# Patient Record
Sex: Male | Born: 2000 | Race: Black or African American | Hispanic: No | Marital: Single | Smoking: Never smoker
Health system: Southern US, Community
[De-identification: ages and names within clinical notes are randomized; demographics above are authoritative.]

## PROBLEM LIST (undated history)

## (undated) ENCOUNTER — Ambulatory Visit: Admission: EM | Payer: Self-pay | Source: Home / Self Care

## (undated) DIAGNOSIS — F988 Other specified behavioral and emotional disorders with onset usually occurring in childhood and adolescence: Secondary | ICD-10-CM

## (undated) DIAGNOSIS — Z9109 Other allergy status, other than to drugs and biological substances: Secondary | ICD-10-CM

---
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
## 2022-12-07 ENCOUNTER — Ambulatory Visit
Admission: EM | Admit: 2022-12-07 | Discharge: 2022-12-07 | Disposition: A | Discharge status: Home / Self Care | Payer: Managed Care, Other (non HMO) | Attending: Internal Medicine | Admitting: Internal Medicine

## 2022-12-07 DIAGNOSIS — M79672 Pain in left foot: Secondary | ICD-10-CM

## 2022-12-07 DIAGNOSIS — M79671 Pain in right foot: Secondary | ICD-10-CM | POA: Diagnosis not present

## 2022-12-07 MED ORDER — NAPROXEN 500 MG PO TABS: 500.0000 mg | ORAL_TABLET | Freq: Two times a day (BID) | ORAL | 0 refills | Status: AC | PRN

## 2022-12-07 NOTE — Discharge Instructions (Signed)
You may start naproxen twice daily for 7 days as needed.  Elevate your feet as needed.  I recommend you get shoe inserts as well as wear compression stockings while at work.  Please follow-up with your PCP if your symptoms do not improve.  Please go to the emergency room if you develop any worsening symptoms.  I hope you feel better soon!

## 2022-12-07 NOTE — ED Provider Notes (Signed)
[facility]    CSN: [id number] Arrival date & time: 12/07/22  1022      History   Chief Complaint Chief Complaint  Patient presents with   Foot Pain   Leg Pain    HPI Roy Crane IV is a 22 y.o. male presents for foot pain.  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  Reports he normally has some foot soreness after work but normally by the morning it is better or resolved.  He worked last night and states when he woke this morning he continues to have foot soreness/pain.  States he has no pain if he is sitting and resting and pain is only with weightbearing or walking.  Denies any change in work activities or duties on working more than normal.  Denies any swelling, ecchymosis, erythema.  No injury to the feet.  Does state that the pain will radiate up into his legs but denies calf pain, swelling, redness, warmth.  No chest pain or shortness of breath.  No history of DVT or clotting disorders.  He took ibuprofen last night with minimal improvement.  He has not taken anything today for symptoms.  No other concerns at this time.   Foot Pain  Leg Pain   Past Medical History:  Diagnosis Date   ADD (attention deficit disorder)    Asthma    Environmental allergies     Patient Active Problem List   Diagnosis Date Noted   Asthma 05/06/2010    History reviewed. No pertinent surgical history.     Home Medications    Prior to Admission medications   Medication Sig Start Date End Date Taking? Authorizing Provider  naproxen (NAPROSYN) 500 MG tablet Take 1 tablet (500 mg total) by mouth 2 (two) times daily as needed for up to 7 days (foot pain). 12/07/22 12/14/22 Yes Radford Pax, NP  albuterol (PROVENTIL) (2.5 MG/3ML) 0.083% nebulizer solution Take 2.5 mg by nebulization every 6 (six) hours as needed.      [provider]  beclomethasone (QVAR) 40 MCG/ACT inhaler Inhale into the lungs 2 (two) times daily.    [provider]  lisdexamfetamine (VYVANSE) 40 MG capsule Take 40 mg by mouth every morning.    [provider]    Family History Family History  Problem Relation Age of Onset   Diabetes Other    Stroke Other    Heart failure Other    Cancer Other        colon   Diabetes Other     Social History Social History   Tobacco Use   Smoking status: Never   Smokeless tobacco: Never  Substance Use Topics   Alcohol use: No   Drug use: No     Allergies   Patient has no known allergies.   Review of Systems Review of Systems  Musculoskeletal:        Bilateral foot pain     Physical Exam Triage Vital Signs ED Triage Vitals [12/07/22 1112]  Encounter Vitals Group     BP (!) 140/83     Systolic BP Percentile      Diastolic BP Percentile      Pulse Rate 60     Resp 17     Temp 98.1 F (36.7 C)     Temp Source Oral     SpO2 98 %     Weight      Height  Head Circumference      Peak Flow      Pain Score 2     Pain Loc      Pain Education      Exclude from Growth Chart    No data found.  Updated Vital Signs BP (!) 140/83 (BP Location: Right Arm)   Pulse 60   Temp 98.1 F (36.7 C) (Oral)   Resp 17   SpO2 98%   Visual Acuity Right Eye Distance:   Left Eye Distance:   Bilateral Distance:    Right Eye Near:   Left Eye Near:    Bilateral Near:     Physical Exam Vitals and nursing note reviewed.  Constitutional:      General: He is not in acute distress.    Appearance: Normal appearance. He is not ill-appearing.  HENT:     Head: Normocephalic and atraumatic.  Eyes:     Pupils: Pupils are equal, round, and reactive to light.  Cardiovascular:     Rate and Rhythm: Normal rate.  Pulmonary:     Effort: Pulmonary effort is normal.  Musculoskeletal:       Feet:  Feet:     Comments: There is no swelling, ecchymosis, erythema of the foot.  There is tenderness to palpation to the mid plantar aspect of foot bilaterally.  No tenderness with  palpation to dorsum of foot, heel, bilateral lower legs/calves.  Right calf measures 48 cm, left calf measures 47 cm.  No tenderness to palpation along posterior calf.  No redness or warmth of calf bilaterally.  Negative Homans' sign. Skin:    General: Skin is warm and dry.  Neurological:     General: No focal deficit present.     Mental Status: He is alert and oriented to person, place, and time.  Psychiatric:        Mood and Affect: Mood normal.        Behavior: Behavior normal.      UC Treatments / Results  Labs (all labs ordered are listed, but only abnormal results are displayed) Labs Reviewed - No data to display  EKG   Radiology No results found.  Procedures Procedures (including critical care time)  Medications Ordered in UC Medications - No data to display  Initial Impression / Assessment and Plan / UC Course  I have reviewed the triage vital signs and the nursing notes.  Pertinent labs & imaging results that were available during my care of the patient were reviewed by me and considered in my medical decision making (see chart for details).     Reviewed exam and symptoms with patient.  No red flags.  Patient with bilateral foot pain/aching after working on his feet all day yesterday.  Discussed with patient likely muscle soreness from walking on hard surface at work.  Will do trial of naproxen twice daily.  Patient declined injection in clinic.  Also advised shoe inserts and compression stockings while at work.  Patient to follow-up with his PCP if his symptoms do not improve.  ER precautions reviewed and patient verbalized understanding. Final Clinical Impressions(s) / UC Diagnoses   Final diagnoses:  Bilateral foot pain     Discharge Instructions      You may start naproxen twice daily for 7 days as needed.  Elevate your feet as needed.  I recommend you get shoe inserts as well as wear compression stockings while at work.  Please follow-up with your PCP if  your symptoms do not  improve.  Please go to the emergency room if you develop any worsening symptoms.  I hope you feel better soon!    ED Prescriptions     Medication Sig Dispense Auth. Provider   naproxen (NAPROSYN) 500 MG tablet Take 1 tablet (500 mg total) by mouth 2 (two) times daily as needed for up to 7 days (foot pain). 14 tablet Radford Pax, NP      PDMP not reviewed this encounter.   Radford Pax, NP 12/07/22 [phone]

## 2022-12-07 NOTE — ED Triage Notes (Signed)
Pt presents with c/o bilateral foot and leg pain x 2 days. Pt states it hurts when he walks and denies injuries.

## 2023-05-07 ENCOUNTER — Ambulatory Visit
Admission: EM | Admit: 2023-05-07 | Discharge: 2023-05-07 | Disposition: A | Discharge status: Home / Self Care | Attending: Family Medicine | Admitting: Family Medicine

## 2023-05-07 ENCOUNTER — Encounter: Payer: Self-pay | Admitting: Emergency Medicine

## 2023-05-07 DIAGNOSIS — B349 Viral infection, unspecified: Secondary | ICD-10-CM | POA: Insufficient documentation

## 2023-05-07 DIAGNOSIS — J4521 Mild intermittent asthma with (acute) exacerbation: Secondary | ICD-10-CM | POA: Insufficient documentation

## 2023-05-07 DIAGNOSIS — R051 Acute cough: Secondary | ICD-10-CM | POA: Insufficient documentation

## 2023-05-07 LAB — POCT INFLUENZA A/B
Influenza A, POC: NEGATIVE
Influenza B, POC: NEGATIVE

## 2023-05-07 MED ORDER — BENZONATATE 200 MG PO CAPS: 200.0000 mg | ORAL_CAPSULE | Freq: Three times a day (TID) | ORAL | 0 refills | Status: DC | PRN

## 2023-05-07 MED ORDER — PREDNISONE 20 MG PO TABS: 40.0000 mg | ORAL_TABLET | Freq: Every day | ORAL | 0 refills | Status: AC

## 2023-05-07 MED ORDER — IPRATROPIUM-ALBUTEROL 0.5-2.5 (3) MG/3ML IN SOLN
3.0000 mL | Freq: Once | RESPIRATORY_TRACT | Status: AC
  Administered 2023-05-07: 3 mL via RESPIRATORY_TRACT

## 2023-05-07 MED ORDER — ALBUTEROL SULFATE HFA 108 (90 BASE) MCG/ACT IN AERS: 1.0000 | INHALATION_SPRAY | Freq: Four times a day (QID) | RESPIRATORY_TRACT | 0 refills | Status: DC | PRN

## 2023-05-07 NOTE — ED Provider Notes (Signed)
 [facility]    CSN: [id number] Arrival date & time: 05/07/23  0955      History   Chief Complaint Chief Complaint  Patient presents with   Cough   Nasal Congestion    HPI Roy Crane is a 23 y.o. male  presents for evaluation of URI symptoms for 3 days. Patient reports associated symptoms of cough, congestion, shortness of breath. Denies N/V/D, fevers, sore throat, ear pain, body aches. Patient does have a hx of asthma.  States he does not have an inhaler.  Patient is not an active smoker.   Reports no sick contacts.  Pt has taken nothing OTC for symptoms. Pt has no other concerns at this time.    Cough Associated symptoms: shortness of breath     Past Medical History:  Diagnosis Date   ADD (attention deficit disorder)    Asthma    Environmental allergies     Patient Active Problem List   Diagnosis Date Noted   Asthma 05/06/2010    History reviewed. No pertinent surgical history.     Home Medications    Prior to Admission medications   Medication Sig Start Date End Date Taking? Authorizing Provider  albuterol (VENTOLIN HFA) 108 (90 Base) MCG/ACT inhaler Inhale 1-2 puffs into the lungs every 6 (six) hours as needed. 05/07/23  Yes Radford Pax, NP  benzonatate (TESSALON) 200 MG capsule Take 1 capsule (200 mg total) by mouth 3 (three) times daily as needed. 05/07/23  Yes Radford Pax, NP  predniSONE (DELTASONE) 20 MG tablet Take 2 tablets (40 mg total) by mouth daily with breakfast for 5 days. 05/07/23 05/12/23 Yes Radford Pax, NP  beclomethasone (QVAR) 40 MCG/ACT inhaler Inhale into the lungs 2 (two) times daily.    [provider]  lisdexamfetamine (VYVANSE) 40 MG capsule Take 40 mg by mouth every morning.    [provider]    Family History Family History  Problem Relation Age of Onset   Diabetes Other    Stroke Other    Heart failure Other    Cancer Other        colon   Diabetes Other     Social History Social History    Tobacco Use   Smoking status: Never   Smokeless tobacco: Never  Substance Use Topics   Alcohol use: No   Drug use: No     Allergies   Patient has no known allergies.   Review of Systems Review of Systems  HENT:  Positive for congestion.   Respiratory:  Positive for cough and shortness of breath.      Physical Exam Triage Vital Signs ED Triage Vitals  Encounter Vitals Group     BP 05/07/23 1009 130/81     Systolic BP Percentile --      Diastolic BP Percentile --      Pulse Rate 05/07/23 1009 68     Resp 05/07/23 1009 18     Temp 05/07/23 1009 98.8 F (37.1 C)     Temp Source 05/07/23 1009 Oral     SpO2 05/07/23 1009 97 %     Weight --      Height --      Head Circumference --      Peak Flow --      Pain Score 05/07/23 1007 4     Pain Loc --      Pain Education --      Exclude from Growth Chart --  No data found.  Updated Vital Signs BP 130/81 (BP Location: Left Arm)   Pulse 68   Temp 98.8 F (37.1 C) (Oral)   Resp 18   SpO2 97%   Visual Acuity Right Eye Distance:   Left Eye Distance:   Bilateral Distance:    Right Eye Near:   Left Eye Near:    Bilateral Near:     Physical Exam Vitals and nursing note reviewed.  Constitutional:      General: He is not in acute distress.    Appearance: Normal appearance. He is not ill-appearing or toxic-appearing.  HENT:     Head: Normocephalic and atraumatic.     Right Ear: Tympanic membrane and ear canal normal.     Left Ear: Tympanic membrane and ear canal normal.     Nose: Congestion present.     Mouth/Throat:     Mouth: Mucous membranes are moist.     Pharynx: No oropharyngeal exudate or posterior oropharyngeal erythema.  Eyes:     Pupils: Pupils are equal, round, and reactive to light.  Cardiovascular:     Rate and Rhythm: Normal rate and regular rhythm.     Heart sounds: Normal heart sounds.  Pulmonary:     Effort: Pulmonary effort is normal.     Breath sounds: Normal breath sounds.   Musculoskeletal:     Cervical back: Normal range of motion and neck supple.  Lymphadenopathy:     Cervical: No cervical adenopathy.  Skin:    General: Skin is warm and dry.  Neurological:     General: No focal deficit present.     Mental Status: He is alert and oriented to person, place, and time.  Psychiatric:        Mood and Affect: Mood normal.        Behavior: Behavior normal.      UC Treatments / Results  Labs (all labs ordered are listed, but only abnormal results are displayed) Labs Reviewed  SARS CORONAVIRUS 2 (TAT 6-24 HRS)  POCT INFLUENZA A/B    EKG   Radiology No results found.  Procedures Procedures (including critical care time)  Medications Ordered in UC Medications  ipratropium-albuterol (DUONEB) 0.5-2.5 (3) MG/3ML nebulizer solution 3 mL (3 mLs Nebulization Given 05/07/23 1041)    Initial Impression / Assessment and Plan / UC Course  I have reviewed the triage vital signs and the nursing notes.  Pertinent labs & imaging results that were available during my care of the patient were reviewed by me and considered in my medical decision making (see chart for details).     Reviewed exam and symptoms with patient.  No red flags.  Patient given DuoNeb in clinic for his shortness of breath with reported improvement in symptoms.  Negative rapid flu.  COVID PCR and will contact if positive.  Discussed viral illness with asthma exacerbation.  Symptom butyryl inhaler and prednisone.  Tessalon as needed cough.  Discussed fluids and rest.  Advised PCP follow-up if symptoms do not improve.  ER precautions reviewed and patient verbalized understanding. Final Clinical Impressions(s) / UC Diagnoses   Final diagnoses:  Acute cough  Mild intermittent asthma with acute exacerbation  Viral illness     Discharge Instructions      The clinical contact you with results of the COVID test done today if positive.  I have sent you an albuterol inhaler to use as needed  for wheezing or shortness of breath.  Tessalon as needed for your cough.  Start prednisone daily for 5 days.  Lots of rest and fluids.  Please follow-up with your PCP if your symptoms do not improve.  Please go to the ER for any worsening symptoms.  Hope you feel better soon!     ED Prescriptions     Medication Sig Dispense Auth. Provider   albuterol (VENTOLIN HFA) 108 (90 Base) MCG/ACT inhaler Inhale 1-2 puffs into the lungs every 6 (six) hours as needed. 1 each Radford Pax, NP   predniSONE (DELTASONE) 20 MG tablet Take 2 tablets (40 mg total) by mouth daily with breakfast for 5 days. 10 tablet Radford Pax, NP   benzonatate (TESSALON) 200 MG capsule Take 1 capsule (200 mg total) by mouth 3 (three) times daily as needed. 20 capsule Radford Pax, NP      PDMP not reviewed this encounter.   Radford Pax, NP 05/07/23 [phone]

## 2023-05-07 NOTE — ED Triage Notes (Signed)
 Pt c/o cough and congestion for 3 days. Denies taking any medications for symptoms.

## 2023-05-07 NOTE — Discharge Instructions (Signed)
 The clinical contact you with results of the COVID test done today if positive.  I have sent you an albuterol inhaler to use as needed for wheezing or shortness of breath.  Tessalon as needed for your cough.  Start prednisone daily for 5 days.  Lots of rest and fluids.  Please follow-up with your PCP if your symptoms do not improve.  Please go to the ER for any worsening symptoms.  Hope you feel better soon!

## 2023-05-08 LAB — SARS CORONAVIRUS 2 (TAT 6-24 HRS): SARS Coronavirus 2: NEGATIVE

## 2023-06-12 ENCOUNTER — Ambulatory Visit
Admission: EM | Admit: 2023-06-12 | Discharge: 2023-06-12 | Disposition: A | Discharge status: Home / Self Care | Attending: Family Medicine | Admitting: Family Medicine

## 2023-06-12 DIAGNOSIS — J4521 Mild intermittent asthma with (acute) exacerbation: Secondary | ICD-10-CM | POA: Diagnosis not present

## 2023-06-12 MED ORDER — PREDNISONE 20 MG PO TABS: 40.0000 mg | ORAL_TABLET | Freq: Every day | ORAL | 0 refills | Status: AC

## 2023-06-12 MED ORDER — ALBUTEROL SULFATE HFA 108 (90 BASE) MCG/ACT IN AERS: 1.0000 | INHALATION_SPRAY | Freq: Four times a day (QID) | RESPIRATORY_TRACT | 0 refills | Status: AC | PRN

## 2023-06-12 NOTE — ED Triage Notes (Addendum)
 Pt present with c/o chest tightness wile doing PT about 1 hr ago. Pt stats he used his inhaler, and EMS gave him a breathing treatment. Pt states after the breathing treatment he felt better.

## 2023-06-12 NOTE — Discharge Instructions (Signed)
 I refilled your albuterol inhaler to use as needed for wheezing or shortness of breath.  Start prednisone daily for 5 days.  Please follow-up with your PCP in 1 to 2 days for recheck.  Please go to the ER if you develop any worsening symptoms.  I hope you feel better soon!

## 2023-06-12 NOTE — ED Provider Notes (Signed)
 [facility]    CSN: [id number] Arrival date & time: 06/12/23  1948      History   Chief Complaint Chief Complaint  Patient presents with   Shortness of Breath    HPI Roy Crane is a 23 y.o. male dents for asthma.  Patient currently is in [employer] for the [employer].  While doing a drill today he developed asthma exacerbation.  He did 3 puffs of his inhaler and EMS was also called they did administer breathing treatment.  He states he felt much better after that but his mother wanted him to come in to get rechecked.  He currently denies any shortness of breath wheezing or chest pain.  No URI/cold symptoms.  He has not needed to use his inhaler since initial event earlier today.  No other concerns at this time.   Shortness of Breath   Past Medical History:  Diagnosis Date   ADD (attention deficit disorder)    Asthma    Environmental allergies     Patient Active Problem List   Diagnosis Date Noted   Asthma 05/06/2010    History reviewed. No pertinent surgical history.     Home Medications    Prior to Admission medications   Medication Sig Start Date End Date Taking? Authorizing Provider  albuterol (VENTOLIN HFA) 108 (90 Base) MCG/ACT inhaler Inhale 1-2 puffs into the lungs every 6 (six) hours as needed. 06/12/23  Yes Radford Pax, NP  predniSONE (DELTASONE) 20 MG tablet Take 2 tablets (40 mg total) by mouth daily with breakfast for 5 days. 06/12/23 06/17/23 Yes Radford Pax, NP  beclomethasone (QVAR) 40 MCG/ACT inhaler Inhale into the lungs 2 (two) times daily.    [provider]  benzonatate (TESSALON) 200 MG capsule Take 1 capsule (200 mg total) by mouth 3 (three) times daily as needed. 05/07/23   Radford Pax, NP  lisdexamfetamine (VYVANSE) 40 MG capsule Take 40 mg by mouth every morning.    [provider]    Family History Family History  Problem Relation Age of Onset   Diabetes Other    Stroke Other    Heart failure Other     Cancer Other        colon   Diabetes Other     Social History Social History   Tobacco Use   Smoking status: Never   Smokeless tobacco: Never  Substance Use Topics   Alcohol use: No   Drug use: No     Allergies   Patient has no known allergies.   Review of Systems Review of Systems  Respiratory:  Positive for shortness of breath.      Physical Exam Triage Vital Signs ED Triage Vitals  Encounter Vitals Group     BP 06/12/23 1954 113/74     Systolic BP Percentile --      Diastolic BP Percentile --      Pulse Rate 06/12/23 1953 79     Resp 06/12/23 1953 18     Temp 06/12/23 1954 98.9 F (37.2 C)     Temp Source 06/12/23 1953 Oral     SpO2 06/12/23 1953 98 %     Weight --      Height --      Head Circumference --      Peak Flow --      Pain Score 06/12/23 1952 3     Pain Loc --      Pain Education --  Exclude from Growth Chart --    No data found.  Updated Vital Signs BP 113/74   Pulse 79   Temp 98.9 F (37.2 C) (Oral)   Resp 18   SpO2 98%   Visual Acuity Right Eye Distance:   Left Eye Distance:   Bilateral Distance:    Right Eye Near:   Left Eye Near:    Bilateral Near:     Physical Exam Vitals and nursing note reviewed.  Constitutional:      General: He is not in acute distress.    Appearance: Normal appearance. He is not ill-appearing or toxic-appearing.  HENT:     Head: Normocephalic and atraumatic.     Nose: No congestion.  Eyes:     Pupils: Pupils are equal, round, and reactive to light.  Cardiovascular:     Rate and Rhythm: Normal rate and regular rhythm.     Heart sounds: Normal heart sounds.  Pulmonary:     Effort: Pulmonary effort is normal.     Breath sounds: Normal breath sounds. No wheezing, rhonchi or rales.  Musculoskeletal:     Cervical back: Normal range of motion and neck supple.  Lymphadenopathy:     Cervical: No cervical adenopathy.  Skin:    General: Skin is warm and dry.  Neurological:     General: No  focal deficit present.     Mental Status: He is alert and oriented to person, place, and time.  Psychiatric:        Mood and Affect: Mood normal.        Behavior: Behavior normal.      UC Treatments / Results  Labs (all labs ordered are listed, but only abnormal results are displayed) Labs Reviewed - No data to display  EKG   Radiology No results found.  Procedures Procedures (including critical care time)  Medications Ordered in UC Medications - No data to display  Initial Impression / Assessment and Plan / UC Course  I have reviewed the triage vital signs and the nursing notes.  Pertinent labs & imaging results that were available during my care of the patient were reviewed by me and considered in my medical decision making (see chart for details).     Reviewed exam and symptoms with patient and mom.  No red flags.  Patient presents with asthma exacerbation earlier today that was treated with inhaler as well as nebulizer with resolution of symptoms.  Currently has no symptoms including shortness of breath or wheezing.  Exam is unremarkable and O2 98% on room air.  I did refill his inhaler and will do prednisone for 5 days.  I did advise a follow-up with their PCP in 1-2 days for recheck.  Strict ER precautions reviewed and patient and mom verbalized understanding. Final Clinical Impressions(s) / UC Diagnoses   Final diagnoses:  Mild intermittent asthma with acute exacerbation     Discharge Instructions      I refilled your albuterol inhaler to use as needed for wheezing or shortness of breath.  Start prednisone daily for 5 days.  Please follow-up with your PCP in 1 to 2 days for recheck.  Please go to the ER if you develop any worsening symptoms.  I hope you feel better soon!    ED Prescriptions     Medication Sig Dispense Auth. Provider   albuterol (VENTOLIN HFA) 108 (90 Base) MCG/ACT inhaler Inhale 1-2 puffs into the lungs every 6 (six) hours as needed. 1 each  Shell Blanchette, Jodi  R, NP   predniSONE (DELTASONE) 20 MG tablet Take 2 tablets (40 mg total) by mouth daily with breakfast for 5 days. 10 tablet Soul Deveney, Jodi R, NP      PDMP not reviewed this encounter.   Alleen Arbour, NP 06/12/23 2004

## 2023-08-03 ENCOUNTER — Ambulatory Visit: Payer: Self-pay

## 2023-08-03 ENCOUNTER — Ambulatory Visit: Payer: Self-pay | Admitting: Physician Assistant

## 2023-08-03 ENCOUNTER — Encounter: Payer: Self-pay | Admitting: Physician Assistant

## 2023-08-03 VITALS — BP 135/67 | HR 60 | Temp 97.6°F | Resp 16 | Ht 73.0 in | Wt 278.0 lb

## 2023-08-03 VITALS — BP 135/67 | Resp 14 | Ht 73.0 in | Wt 278.0 lb

## 2023-08-03 DIAGNOSIS — Z0289 Encounter for other administrative examinations: Secondary | ICD-10-CM

## 2023-08-03 DIAGNOSIS — Z021 Encounter for pre-employment examination: Secondary | ICD-10-CM

## 2023-08-03 LAB — POCT URINALYSIS DIPSTICK
Bilirubin, UA: NEGATIVE
Blood, UA: NEGATIVE
Glucose, UA: NEGATIVE
Ketones, UA: NEGATIVE
Leukocytes, UA: NEGATIVE
Nitrite, UA: NEGATIVE
Protein, UA: NEGATIVE
Spec Grav, UA: 1.01 (ref 1.010–1.025)
Urobilinogen, UA: 0.2 U/dL
pH, UA: 6.5 (ref 5.0–8.0)

## 2023-08-03 NOTE — Progress Notes (Unsigned)
 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  [employer] Acct #:  [id number] [employer] Specimen #:  [id number]  Rapid drug screen results = Negative

## 2023-08-03 NOTE — Progress Notes (Signed)
 [facility] [place] [facility] ____________________________________________   None    (approximate)  I have reviewed the triage vital signs and the nursing notes.   HISTORY  Chief Complaint Employment Physical   HPI Roy Crane is a 23 y.o. male patient presents for preemployment physical for the city of [employer].  Patient voices no concerns.         Past Medical History:  Diagnosis Date   ADD (attention deficit disorder)    Asthma    Environmental allergies     Patient Active Problem List   Diagnosis Date Noted   Insomnia 09/26/2016   Allergic rhinitis 09/25/2015   ADHD (attention deficit hyperactivity disorder) 06/01/2015   Asthma 05/06/2010    No past surgical history on file.  Prior to Admission medications   Medication Sig Start Date End Date Taking? Authorizing Provider  albuterol  (VENTOLIN  HFA) 108 (90 Base) MCG/ACT inhaler Inhale 1-2 puffs into the lungs every 6 (six) hours as needed. 06/12/23   Mayer, Jodi R, NP  cetirizine (ZYRTEC) 10 MG tablet Take 10 mg by mouth daily. 03/21/12   [provider]    Allergies Other  Family History  Problem Relation Age of Onset   Diabetes Other    Stroke Other    Heart failure Other    Cancer Other        colon   Diabetes Other     Social History Social History   Tobacco Use   Smoking status: Never   Smokeless tobacco: Never  Substance Use Topics   Alcohol use: No   Drug use: No    Review of Systems Constitutional: No fever/chills Eyes: No visual changes. ENT: No sore throat. Cardiovascular: Denies chest pain. Respiratory: Denies shortness of breath.  Asthma Gastrointestinal: No abdominal pain.  No nausea, no vomiting.  No diarrhea.  No constipation. Genitourinary: Negative for dysuria. Musculoskeletal: Negative for back pain. Skin: Negative for rash. Neurological: Negative for headaches, focal weakness or numbness. Psychiatric:  ADHD  ____________________________________________   PHYSICAL EXAM:  VITAL SIGNS: BP 135/67  Cuff Size Large  Pulse Rate 60  Temp 97.6 F (36.4 C)  Temp Source Temporal  Weight 278 lb (126.1 kg)  Height 6\' 1"  (1.854 m)  Resp 16  SpO2 99 %   BMI: 36.68 kg/m2  BSA: 2.55 m2  Constitutional: Alert and oriented. Well appearing and in no acute distress. Eyes: Conjunctivae are normal. PERRL. EOMI. Head: Atraumatic. Nose: No congestion/rhinnorhea. Mouth/Throat: Mucous membranes are moist.  Oropharynx non-erythematous. Neck: No stridor.  No cervical spine tenderness to palpation. Hematological/Lymphatic/Immunilogical: No cervical lymphadenopathy. Cardiovascular: Normal rate, regular rhythm. Grossly normal heart sounds.  Good peripheral circulation. Respiratory: Normal respiratory effort.  No retractions. Lungs CTAB. Gastrointestinal: Soft and nontender. No distention. No abdominal bruits. No CVA tenderness. Genitourinary: Deferred Musculoskeletal: No lower extremity tenderness nor edema.  No joint effusions. Neurologic:  Normal speech and language. No gross focal neurologic deficits are appreciated. No gait instability. Skin:  Skin is warm, dry and intact. No rash noted.  Pseudofolliculitis posterior neck. Psychiatric: Mood and affect are normal. Speech and behavior are normal.  ____________________________________________   LABS Pending ____________________________________________  EKG  Sinus bradycardia 50 bpm ____________________________________________    ____________________________________________   INITIAL IMPRESSION / ASSESSMENT AND PLAN / ED COURSE  As part of my medical decision making, I reviewed the following data within the electronic MEDICAL RECORD NUMBER      No acute findings on physical exam or EKG.  Labs pending.        ____________________________________________   FINAL CLINICAL IMPRESSION Well exam   ED Discharge Orders     None         Note:  This document was prepared using Dragon voice recognition software and may include unintentional dictation errors.

## 2023-08-03 NOTE — Progress Notes (Unsigned)
 Pt completed all requirements for PO1 pre-employment physical.

## 2023-08-03 NOTE — Progress Notes (Signed)
 Pt presents today to complete pre-employment PO1 physical and UDS.  UDS is cleared.  Pt has not voiced any concerns at this time. Roy Crane

## 2023-08-04 LAB — HEPATITIS B SURFACE ANTIBODY,QUALITATIVE: Hep B Surface Ab, Qual: NONREACTIVE

## 2023-08-04 LAB — CMP12+LP+TP+TSH+6AC+CBC/D/PLT
ALT: 34 IU/L (ref 0–44)
AST: 22 IU/L (ref 0–40)
Albumin: 4.4 g/dL (ref 4.3–5.2)
Alkaline Phosphatase: 116 IU/L (ref 44–121)
BUN/Creatinine Ratio: 11 (ref 9–20)
BUN: 12 mg/dL (ref 6–20)
Basophils Absolute: 0.1 10*3/uL (ref 0.0–0.2)
Basos: 1 %
Bilirubin Total: 0.5 mg/dL (ref 0.0–1.2)
Calcium: 9.6 mg/dL (ref 8.7–10.2)
Chloride: 102 mmol/L (ref 96–106)
Chol/HDL Ratio: 3.3 ratio (ref 0.0–5.0)
Cholesterol, Total: 111 mg/dL (ref 100–199)
Creatinine, Ser: 1.1 mg/dL (ref 0.76–1.27)
EOS (ABSOLUTE): 0.4 10*3/uL (ref 0.0–0.4)
Eos: 6 %
Estimated CHD Risk: <0.5 times avg. (ref 0.0–1.0)
Free Thyroxine Index: 1.8 (ref 1.2–4.9)
GGT: 23 IU/L (ref 0–65)
Globulin, Total: 2.9 g/dL (ref 1.5–4.5)
Glucose: 90 mg/dL (ref 70–99)
HDL: 34 mg/dL — ABNORMAL LOW (ref >39)
Hematocrit: 44.5 % (ref 37.5–51.0)
Hemoglobin: 14.4 g/dL (ref 13.0–17.7)
Immature Grans (Abs): 0 10*3/uL (ref 0.0–0.1)
Immature Granulocytes: 0 %
Iron: 85 ug/dL (ref 38–169)
LDH: 161 IU/L (ref 121–224)
LDL Chol Calc (NIH): 54 mg/dL (ref 0–99)
Lymphocytes Absolute: 3.3 10*3/uL — ABNORMAL HIGH (ref 0.7–3.1)
Lymphs: 46 %
MCH: 25.2 pg — ABNORMAL LOW (ref 26.6–33.0)
MCHC: 32.4 g/dL (ref 31.5–35.7)
MCV: 78 fL — ABNORMAL LOW (ref 79–97)
Monocytes Absolute: 0.8 10*3/uL (ref 0.1–0.9)
Monocytes: 12 %
Neutrophils Absolute: 2.5 10*3/uL (ref 1.4–7.0)
Neutrophils: 35 %
Phosphorus: 3.7 mg/dL (ref 2.8–4.1)
Platelets: 308 10*3/uL (ref 150–450)
Potassium: 4.4 mmol/L (ref 3.5–5.2)
RBC: 5.72 x10E6/uL (ref 4.14–5.80)
RDW: 15.4 % (ref 11.6–15.4)
Sodium: 139 mmol/L (ref 134–144)
T3 Uptake Ratio: 27 % (ref 24–39)
T4, Total: 6.5 ug/dL (ref 4.5–12.0)
TSH: 1.22 u[IU]/mL (ref 0.450–4.500)
Total Protein: 7.3 g/dL (ref 6.0–8.5)
Triglycerides: 126 mg/dL (ref 0–149)
Uric Acid: 5.4 mg/dL (ref 3.8–8.4)
VLDL Cholesterol Cal: 23 mg/dL (ref 5–40)
WBC: 7.1 10*3/uL (ref 3.4–10.8)
eGFR: 98 mL/min/{1.73_m2} (ref >59)

## 2023-11-14 ENCOUNTER — Encounter: Payer: Self-pay | Admitting: Physician Assistant

## 2023-11-14 ENCOUNTER — Ambulatory Visit: Payer: Self-pay | Admitting: Physician Assistant

## 2023-11-14 VITALS — BP 134/66 | HR 68 | Temp 97.5°F | Resp 14

## 2023-11-14 DIAGNOSIS — S8982XA Other specified injuries of left lower leg, initial encounter: Secondary | ICD-10-CM

## 2023-11-14 NOTE — Progress Notes (Signed)
   Subjective: Left knee pain    Patient ID: Roy Crane, male    DOB: [birth year], 23 y.o.   MRN: [id number]  HPI Patient presents for reevaluation of left knee pain secondary to a hyperextension incident which occurred 3 days ago.  Patient was walking downstairs and missed a step hyperextension of the left knee.  Patient did not fall.  Patient rates pain as a 3/10.  Denies instability, edema, or erythema.  Ambulates with normal gait.   Review of Systems Allergic rhinitis and asthma    Objective:   Physical Exam BP 134/66  Pulse Rate 68  Temp 97.5 F (36.4 C)  Resp 14  SpO2 98 %  No acute distress.  Normal gait. Examination of the left knee reveals no deformity, edema, erythema, or ecchymosis.  No crepitus with palpation of the anterior patella.  Strength against resistance 5/5.  Patient able to toe lift, heel lift, and perform squats without discomfort.       Assessment & Plan: Hyperextension injury  Advised supportive care and use an elastic knee support for the next 3 to 5 days.  Over-the-counter anti-inflammatory medications.  Return back to clinic condition worsens or no improvement.

## 2023-11-14 NOTE — Progress Notes (Signed)
 Reports missing 2 steps while descending stairs on DOI report 11/11/23.  Reports at rest left knee pain about 3 and with movement about level 4.  Stated he has sprained his left knee before as well as ankles.  Taking Ibuprofen  with some relief and used RICE method with some relief since DOI.
